# Patient Record
Sex: Male | Born: 1950 | Race: Black or African American | Hispanic: No | Marital: Married | Smoking: Never smoker
Health system: Southern US, Community
[De-identification: ages and names within clinical notes are randomized; demographics above are authoritative.]

## PROBLEM LIST (undated history)

## (undated) DIAGNOSIS — C801 Malignant (primary) neoplasm, unspecified: Secondary | ICD-10-CM

## (undated) DIAGNOSIS — C61 Malignant neoplasm of prostate: Secondary | ICD-10-CM

## (undated) DIAGNOSIS — K4090 Unilateral inguinal hernia, without obstruction or gangrene, not specified as recurrent: Secondary | ICD-10-CM

## (undated) DIAGNOSIS — K635 Polyp of colon: Secondary | ICD-10-CM

## (undated) DIAGNOSIS — E785 Hyperlipidemia, unspecified: Secondary | ICD-10-CM

## (undated) DIAGNOSIS — K649 Unspecified hemorrhoids: Secondary | ICD-10-CM

## (undated) DIAGNOSIS — I10 Essential (primary) hypertension: Secondary | ICD-10-CM

## (undated) DIAGNOSIS — K589 Irritable bowel syndrome without diarrhea: Secondary | ICD-10-CM

## (undated) DIAGNOSIS — R143 Flatulence: Secondary | ICD-10-CM

## (undated) DIAGNOSIS — K219 Gastro-esophageal reflux disease without esophagitis: Secondary | ICD-10-CM

## (undated) HISTORY — DX: Polyp of colon: K63.5

## (undated) HISTORY — DX: Gastro-esophageal reflux disease without esophagitis: K21.9

## (undated) HISTORY — DX: Unilateral inguinal hernia, without obstruction or gangrene, not specified as recurrent: K40.90

## (undated) HISTORY — DX: Hyperlipidemia, unspecified: E78.5

## (undated) HISTORY — DX: Irritable bowel syndrome without diarrhea: K58.9

## (undated) HISTORY — DX: Malignant (primary) neoplasm, unspecified: C80.1

## (undated) HISTORY — PX: ESOPHAGOGASTRODUODENOSCOPY: SHX1529

## (undated) HISTORY — DX: Unspecified hemorrhoids: K64.9

## (undated) HISTORY — PX: COLONOSCOPY: SHX174

---

## 2007-07-11 DIAGNOSIS — C801 Malignant (primary) neoplasm, unspecified: Secondary | ICD-10-CM

## 2007-07-11 HISTORY — PX: PROSTATE SURGERY: SHX751

## 2007-07-11 HISTORY — DX: Malignant (primary) neoplasm, unspecified: C80.1

## 2012-07-10 DIAGNOSIS — K4090 Unilateral inguinal hernia, without obstruction or gangrene, not specified as recurrent: Secondary | ICD-10-CM

## 2012-07-10 HISTORY — DX: Unilateral inguinal hernia, without obstruction or gangrene, not specified as recurrent: K40.90

## 2012-11-20 ENCOUNTER — Ambulatory Visit: Payer: Self-pay | Admitting: General Surgery

## 2012-11-26 ENCOUNTER — Encounter: Payer: Self-pay | Admitting: General Surgery

## 2012-11-26 ENCOUNTER — Ambulatory Visit (INDEPENDENT_AMBULATORY_CARE_PROVIDER_SITE_OTHER): Payer: BC Managed Care – PPO | Admitting: General Surgery

## 2012-11-26 VITALS — BP 118/74 | HR 74 | Resp 14 | Ht 70.0 in | Wt 181.0 lb

## 2012-11-26 DIAGNOSIS — K4090 Unilateral inguinal hernia, without obstruction or gangrene, not specified as recurrent: Secondary | ICD-10-CM

## 2012-11-26 NOTE — Progress Notes (Signed)
Patient ID: Gavin Hansen, male   DOB: 01-18-1951, 62 y.o.   MRN: [id number]  Chief Complaint  Patient presents with  . Abdominal Pain    hernia evaluation    HPI Gavin Hansen is a 62 y.o. male who presents for an evaluation of a possible hernia. The hernia is located in the left side of groin. The patient states he noticed the hernia approximately 2-3 months ago. He states he has occasional pain in this area but not often.  HPI  Past Medical History  Diagnosis Date  . Cancer 2009    prostate  . Colon polyps   . Hemorrhoid   . IBS (irritable bowel syndrome)   . Hyperlipidemia     Past Surgical History  Procedure Laterality Date  . Prostate surgery  2009    Family History  Problem Relation Age of Onset  . Cancer Father     prostate    Social History History  Substance Use Topics  . Smoking status: Never Smoker   . Smokeless tobacco: Never Used  . Alcohol Use: Yes     Comment: occasionally    No Known Allergies  Current Outpatient Prescriptions  Medication Sig Dispense Refill  . losartan (COZAAR) 50 MG tablet        No current facility-administered medications for this visit.    Review of Systems Review of Systems  Constitutional: Negative.   Respiratory: Negative.   Cardiovascular: Negative.   Gastrointestinal: Negative.     Blood pressure 118/74, pulse 74, resp. rate 14, height 5\' 10"  (1.778 m), weight 181 lb (82.101 kg).  Physical Exam Physical Exam  Constitutional: He appears well-developed and well-nourished.  Eyes: Conjunctivae are normal. No scleral icterus.  Neck: Trachea normal. No mass and no thyromegaly present.  Cardiovascular: Normal rate, regular rhythm, normal heart sounds and normal pulses.   No murmur heard. Pulmonary/Chest: Effort normal and breath sounds normal.  Abdominal: Soft. Normal appearance and bowel sounds are normal. There is no hepatosplenomegaly. There is no tenderness. A hernia is present.  Small reducible left inguinal  hernia.     Data Reviewed none  Assessment    Small left inguinal hernia     Plan    Recommended sutgical repair on elective basis. Laparoscopic and open techniques discudded and pros and cons explained.     Patient wishes to wait and have surgery around November 2014. He will be placed in recalls to come back to the office for a pre-op visit and at that time a surgery date will be arranged.   Vallarie Fei G 11/27/2012, 12:57 PM

## 2012-11-26 NOTE — Patient Instructions (Addendum)
Patient is advised to have hernia repaired at his convenience.  If symptoms become worse patient is advised to contact our office immediately.  Hernia Repair with Laparoscope A hernia occurs when an internal organ pushes out through a weak spot in the belly (abdominal) wall muscles. Hernias most commonly occur in the groin and around the navel. Hernias can also occur through a cut by the surgeon (incision) after an abdominal operation. A hernia may be caused by:  Lifting heavy objects.  Prolonged coughing.  Straining to move your bowels. Hernias can often be pushed back into place (reduced). Most hernias tend to get worse over time. Problems occur when abdominal contents get stuck in the opening and the blood supply is blocked or impaired (incarcerated hernia). Because of these risks, you require surgery to repair the hernia. Your hernia will be repaired using a laparoscope. Laparoscopic surgery is a type of minimally invasive surgery. It does not involve making a typical surgical cut (incision) in the skin. A laparoscope is a telescope-like rod and lens system. It is usually connected to a video camera and a light source so your caregiver can clearly see the operative area. The instruments are inserted through  to  inch (5 mm or 10 mm) openings in the skin at specific locations. A working and viewing space is created by blowing a small amount of carbon dioxide gas into the abdominal cavity. The abdomen is essentially blown up like a balloon (insufflated). This elevates the abdominal wall above the internal organs like a dome. The carbon dioxide gas is common to the human body and can be absorbed by tissue and removed by the respiratory system. Once the repair is completed, the small incisions will be closed with either stitches (sutures) or staples (just like a paper stapler only this staple holds the skin together). LET YOUR CAREGIVERS KNOW ABOUT:  Allergies.  Medications taken including herbs,  eye drops, over the counter medications, and creams.  Use of steroids (by mouth or creams).  Previous problems with anesthetics or Novocaine.  Possibility of pregnancy, if this applies.  History of blood clots (thrombophlebitis).  History of bleeding or blood problems.  Previous surgery.  Other health problems. BEFORE THE PROCEDURE  Laparoscopy can be done either in a hospital or out-patient clinic. You may be given a mild sedative to help you relax before the procedure. Once in the operating room, you will be given a general anesthesia to make you sleep (unless you and your caregiver choose a different anesthetic).  AFTER THE PROCEDURE  After the procedure you will be watched in a recovery area. Depending on what type of hernia was repaired, you might be admitted to the hospital or you might go home the same day. With this procedure you may have less pain and scarring. This usually results in a quicker recovery and less risk of infection. HOME CARE INSTRUCTIONS   Bed rest is not required. You may continue your normal activities but avoid heavy lifting (more than 10 pounds) or straining.  Cough gently. If you are a smoker it is best to stop, as even the best hernia repair can break down with the continual strain of coughing.  Avoid driving until given the OK by your surgeon.  There are no dietary restrictions unless given otherwise.  TAKE ALL MEDICATIONS AS DIRECTED.  Only take over-the-counter or prescription medicines for pain, discomfort, or fever as directed by your caregiver. SEEK MEDICAL CARE IF:   There is increasing abdominal pain or  pain in your incisions.  There is more bleeding from incisions, other than minimal spotting.  You feel light headed or faint.  You develop an unexplained fever, chills, and/or an oral temperature above 102 F (38.9 C).  You have redness, swelling, or increasing pain in the wound.  Pus coming from wound.  A foul smell coming from the  wound or dressings. SEEK IMMEDIATE MEDICAL CARE IF:   You develop a rash.  You have difficulty breathing.  You have any allergic problems. MAKE SURE YOU:   Understand these instructions.  Will watch your condition.  Will get help right away if you are not doing well or get worse. Document Released: 06/26/2005 Document Revised: 09/18/2011 Document Reviewed: 05/26/2009 [facility] Patient Information 2013 [place], [place].  Patient wishes to wait and have surgery around November 2014. He will be placed in recalls to come back to the office for a pre-op visit and at that time a surgery date will be arranged.

## 2012-11-27 ENCOUNTER — Encounter: Payer: Self-pay | Admitting: General Surgery

## 2013-06-04 ENCOUNTER — Ambulatory Visit: Payer: BC Managed Care – PPO | Admitting: General Surgery

## 2013-06-16 ENCOUNTER — Encounter: Payer: Self-pay | Admitting: General Surgery

## 2013-06-16 ENCOUNTER — Ambulatory Visit (INDEPENDENT_AMBULATORY_CARE_PROVIDER_SITE_OTHER): Payer: BC Managed Care – PPO | Admitting: General Surgery

## 2013-06-16 VITALS — Ht 70.0 in | Wt 191.0 lb

## 2013-06-16 DIAGNOSIS — K4090 Unilateral inguinal hernia, without obstruction or gangrene, not specified as recurrent: Secondary | ICD-10-CM | POA: Insufficient documentation

## 2013-06-16 NOTE — Progress Notes (Signed)
The patient presents for a 6 month follow up as well as a pre-op evaluation of a left inguinal hernia. The patient denies any new symptoms at this time.    Discussion with patient in regards to what type of hernia repair he would prefer. Patient had additional questions. He is unsure of time with his work schedule. He will let us know what he plans to do.  He had questions regarding open vs laparoscopic techniques, associated risks, benefits and activity restrictions. These were all explained to him.  Pt is asked to decide on a suitable time and will see him 1-2 weeks before.

## 2013-06-16 NOTE — Patient Instructions (Signed)
Patient to contact our office when he decides to have surgery.

## 2013-06-17 ENCOUNTER — Encounter: Payer: Self-pay | Admitting: General Surgery

## 2014-05-18 ENCOUNTER — Ambulatory Visit (INDEPENDENT_AMBULATORY_CARE_PROVIDER_SITE_OTHER): Payer: Medicare Other | Admitting: General Surgery

## 2014-05-18 ENCOUNTER — Encounter: Payer: Self-pay | Admitting: General Surgery

## 2014-05-18 VITALS — BP 134/78 | HR 72 | Resp 12 | Ht 70.0 in | Wt 191.0 lb

## 2014-05-18 DIAGNOSIS — K4090 Unilateral inguinal hernia, without obstruction or gangrene, not specified as recurrent: Secondary | ICD-10-CM

## 2014-05-18 NOTE — Patient Instructions (Addendum)

## 2014-05-18 NOTE — Progress Notes (Signed)
Patient ID: Gavin Hansen, male   DOB: 1950-12-17, 63 y.o.   MRN: [id number]  Chief Complaint  Patient presents with  . Other    left inguinal hernia    HPI Gavin Hansen is a 63 y.o. male here today to discuss left inguinal hernia surgery. He has a medium sized left ing hernia.  HPI  Past Medical History  Diagnosis Date  . Cancer 2009    prostate  . Colon polyps   . Hemorrhoid   . IBS (irritable bowel syndrome)   . Hyperlipidemia   . Left inguinal hernia 2014  . GERD (gastroesophageal reflux disease)     Past Surgical History  Procedure Laterality Date  . Prostate surgery  2009    Family History  Problem Relation Age of Onset  . Cancer Father     prostate    Social History History  Substance Use Topics  . Smoking status: Never Smoker   . Smokeless tobacco: Never Used  . Alcohol Use: Yes     Comment: occasionally    No Known Allergies  Current Outpatient Prescriptions  Medication Sig Dispense Refill  . losartan (COZAAR) 50 MG tablet Take 50 mg by mouth daily.      No current facility-administered medications for this visit.    Review of Systems Review of Systems  Constitutional: Negative.   Respiratory: Negative.   Cardiovascular: Negative.     Blood pressure 134/78, pulse 72, resp. rate 12, height 5\' 10"  (1.778 m), weight 191 lb (86.637 kg).  Physical Exam Physical Exam  Constitutional: He is oriented to person, place, and time. He appears well-developed and well-nourished.  Eyes: Conjunctivae are normal. No scleral icterus.  Neck: Neck supple.  Cardiovascular: Normal rate, regular rhythm and normal heart sounds.   Pulmonary/Chest: Effort normal and breath sounds normal.  Abdominal: Soft. Normal appearance and bowel sounds are normal. There is no hepatomegaly. There is no tenderness. A hernia is present. Hernia confirmed positive in the left inguinal area.  Neurological: He is alert and oriented to person, place, and time.  Skin: Skin is warm and dry.     Data Reviewed None  Assessment    Left inguinal hernia present- no change since last visit.     Plan  Discussed inguinal hernia surgery. Pt elected to have open repair under MAC     Patient's surgery has been scheduled for 07-01-14 at [facility].   SANKAR,SEEPLAPUTHUR G 05/18/2014, 8:24 PM

## 2014-06-01 ENCOUNTER — Ambulatory Visit: Payer: Self-pay | Admitting: Unknown Physician Specialty

## 2014-06-10 ENCOUNTER — Telehealth: Payer: Self-pay | Admitting: *Deleted

## 2014-06-10 NOTE — Telephone Encounter (Signed)
Please let pt know the treatment for H pylori should not interfere with the surgery

## 2014-06-10 NOTE — Telephone Encounter (Signed)
Message left on patient's cell phone stating that it is okay to proceed upcoming surgery. No need to return the call unless he has further questions.

## 2014-06-10 NOTE — Telephone Encounter (Signed)
Patient called the office to report he has bacteria in his stomach. This patient was told that it was H pylori and that it could lead to gastritis. The patient states that he has been placed on 2 antibiotics that he will start today and be on for 2 weeks. Patient is unsure of name of medications.   Patient is currently scheduled for left inguinal hernia repair on 07-01-14 at [facility].   He would like to know if this will interfere with scheduled surgery in three weeks.   Patient was told that this should be okay but that we would talk to Dr. Jamal Collin and get back with him if there is any indication to reschedule.

## 2014-06-19 ENCOUNTER — Other Ambulatory Visit: Payer: Self-pay | Admitting: General Surgery

## 2014-06-19 DIAGNOSIS — K4090 Unilateral inguinal hernia, without obstruction or gangrene, not specified as recurrent: Secondary | ICD-10-CM

## 2014-06-22 ENCOUNTER — Ambulatory Visit: Payer: Self-pay | Admitting: General Surgery

## 2014-07-01 ENCOUNTER — Ambulatory Visit: Payer: Self-pay | Admitting: General Surgery

## 2014-07-01 DIAGNOSIS — K4090 Unilateral inguinal hernia, without obstruction or gangrene, not specified as recurrent: Secondary | ICD-10-CM

## 2014-07-01 HISTORY — PX: HERNIA REPAIR: SHX51

## 2014-07-06 ENCOUNTER — Encounter: Payer: Self-pay | Admitting: General Surgery

## 2014-07-16 ENCOUNTER — Ambulatory Visit (INDEPENDENT_AMBULATORY_CARE_PROVIDER_SITE_OTHER): Payer: Self-pay | Admitting: General Surgery

## 2014-07-16 ENCOUNTER — Encounter: Payer: Self-pay | Admitting: General Surgery

## 2014-07-16 VITALS — BP 122/78 | HR 82 | Resp 12 | Ht 70.0 in | Wt 196.0 lb

## 2014-07-16 DIAGNOSIS — K4090 Unilateral inguinal hernia, without obstruction or gangrene, not specified as recurrent: Secondary | ICD-10-CM

## 2014-07-16 NOTE — Patient Instructions (Addendum)
The patient is aware to call back for any questions or concerns. Gradually increase activity.

## 2014-07-16 NOTE — Progress Notes (Signed)
He is here today for his postoperative visit, left inguinal hernia repair on 07-01-14. He states he is doing well. Minimal tenderness. Bowels still a little constipated but he is taking stool softeners.  Incision healing well, repair is intact.  Gradually increase activity.  Follow up in one month.

## 2014-07-17 ENCOUNTER — Encounter: Payer: Self-pay | Admitting: General Surgery

## 2014-08-17 ENCOUNTER — Ambulatory Visit: Payer: Medicare Other | Admitting: General Surgery

## 2014-09-10 ENCOUNTER — Encounter: Payer: Self-pay | Admitting: *Deleted

## 2014-09-28 ENCOUNTER — Encounter: Payer: Self-pay | Admitting: General Surgery

## 2014-09-28 ENCOUNTER — Ambulatory Visit (INDEPENDENT_AMBULATORY_CARE_PROVIDER_SITE_OTHER): Payer: Medicare Other | Admitting: General Surgery

## 2014-09-28 VITALS — Ht 70.0 in

## 2014-09-28 DIAGNOSIS — K4090 Unilateral inguinal hernia, without obstruction or gangrene, not specified as recurrent: Secondary | ICD-10-CM

## 2014-09-28 NOTE — Progress Notes (Signed)
Patient ID: Gavin Hansen, male   DOB: [birth year], 64 y.o.   MRN: [id number] He is here today for his postoperative visit, left inguinal hernia repair on 07-01-14. He states he is doing well. He denies any tenderness. He reports that the previous constipation has resolved itself.  Incision has healed well and is intact. No hernia noted Patient will return as needed. No activity restrictions.

## 2014-09-28 NOTE — Patient Instructions (Signed)
Call with any problems

## 2014-09-30 ENCOUNTER — Encounter: Payer: Self-pay | Admitting: General Surgery

## 2014-11-02 LAB — SURGICAL PATHOLOGY

## 2014-11-04 NOTE — Op Note (Signed)
PATIENT NAME:  Gavin Hansen, Gavin Hansen MR#:  [id number] DATE OF BIRTH:  08-11-1950  DATE OF PROCEDURE:  07/01/2014  PREOPERATIVE DIAGNOSIS: Left inguinal hernia.   POSTOPERATIVE DIAGNOSIS: Left inguinal  hernia.   OPERATION: Repair of left inguinal hernia.   SURGEON: Mckinley Jewel, M.D.   ANESTHESIA: Monitored care with local anesthetic containing 0.5% Marcaine with 1% Xylocaine.   COMPLICATIONS: None.   ESTIMATED BLOOD LOSS: Minimal.   DRAINS: None.   DESCRIPTION OF PROCEDURE: The patient was placed in the supine position on the operating table. With adequate sedation and monitoring, the left groin was prepped and draped out as a sterile field. Timeout was performed. Anterior/superior iliac spine and the pubic tubercle were marked with incision mapped out along the medial two-thirds of the inguinal canal. This area was infiltrated with local anesthetic, and then a skin incision was made and deepened through to the external oblique and bleeding controlled with cautery and ligatures of 3-0 Vicryl. The deeper tissue was instilled with local anesthetic, as also the inguinal canal. The external oblique was opened along the line of its fibers. Exploration revealed the patient, in fact, had a very long and thick hernial protrusion containing actually a sac and a portion of preperitoneal tissue. This was located just medial to the internal ring and was therefore essentially a direct hernia, although had an indirect type sac. After ensuring there were no other hernias within the sac, the fascia overlying the cord was closed with 2-0 Vicryl. The hernia was satisfactorily exposed along the posterior wall of the inguinal canal just medial to the internal ring. The cord was satisfactorily freed from the posterior wall to allow for this. The sac was excised out completely and then suture ligated with 2-0 Vicryl, and this was transfixed to the internal oblique muscle and the superior edge. The opening within the  transversalis fascia was then closed with 2 figure-of-eight stitches of 2-0 Vicryl. The ProGrip mesh was brought up to the field and then placed around the internal ring, and was snugged down to the posterior wall with the lateral ends tucked underneath the external oblique. The medial end was fixed to the pubic tubercle with a single 2-0 PDS stitch.   After the area was inspected once again, the repair was noted to be adequate, and hemostasis was intact. The wound was irrigated and closed. The external oblique closed with a running 2-0 PDS. The subcutaneous tissue was closed with 3-0 Vicryl and the skin with subcuticular 4-0 Vicryl, reinforced with LiquiBand. The procedure was well tolerated. He was subsequently returned to the recovery room in stable condition.    ____________________________ S.Robinette Haines, MD sgs:JT D: 07/01/2014 14:14:08 ET T: 07/01/2014 14:20:54 ET JOB#: [id number]  cc: S.G. Jamal Collin, MD, <Dictator> [facility] Robinette Haines MD ELECTRONICALLY SIGNED 07/16/2014 6:43

## 2016-06-04 IMAGING — US ABDOMEN ULTRASOUND
1 series · 14 of 25 positions shown · non-contrast
Comparison: None.

CLINICAL DATA: Epigastric pain

EXAM:
ULTRASOUND ABDOMEN COMPLETE

[Series 1: abdomen ultrasound · 0.30mm/px · 14 of 77 slices shown]
[im 1/77]
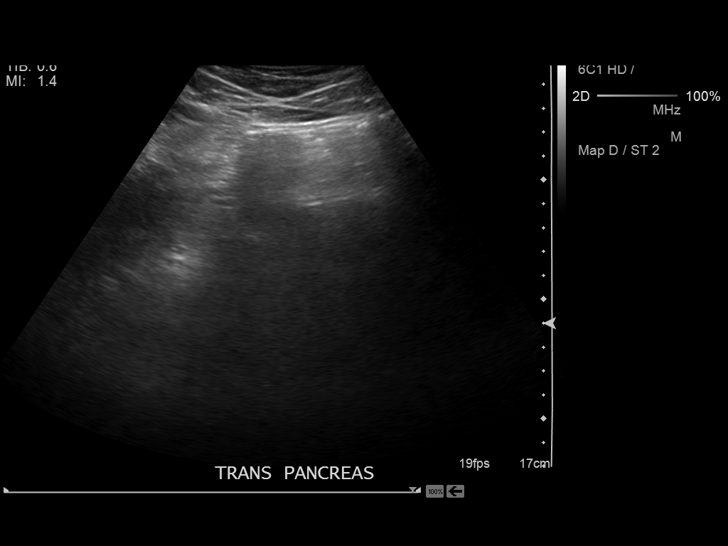
[im 7/77]
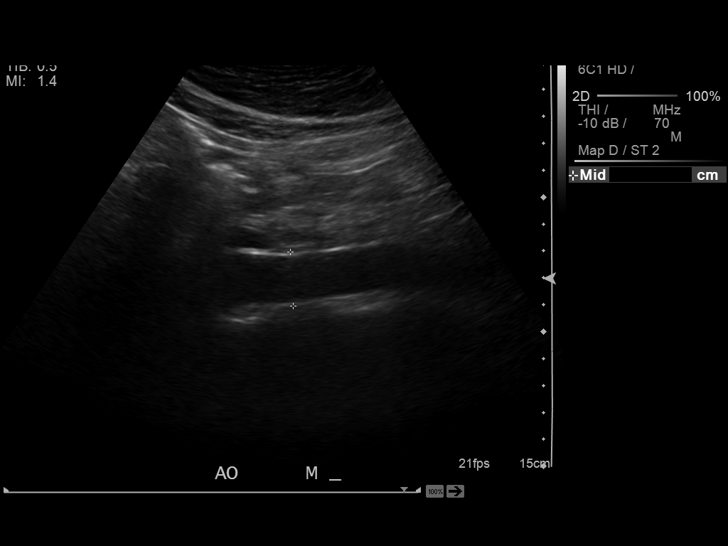
[im 13/77]
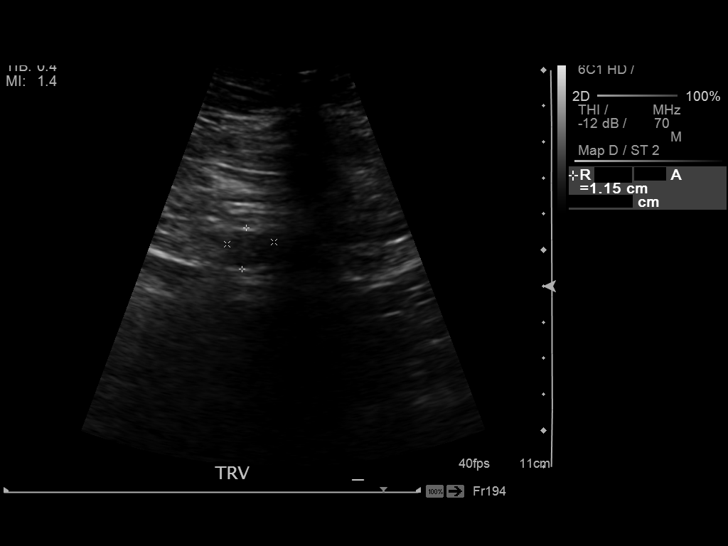
[im 20/77]
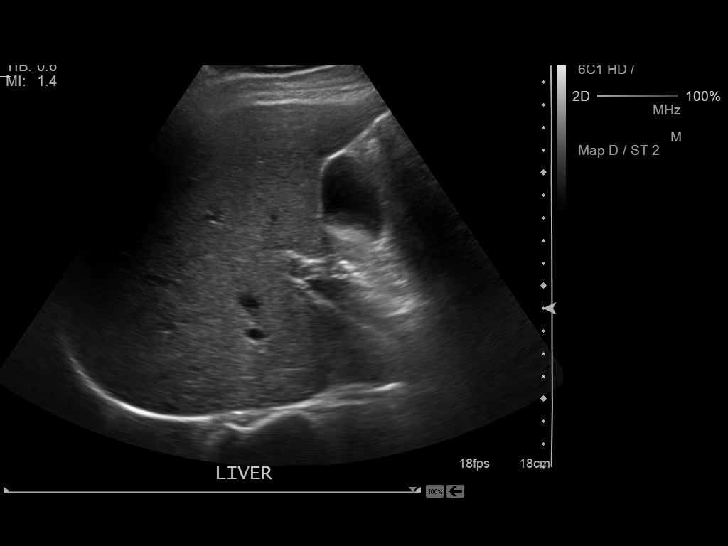
[im 26/77]
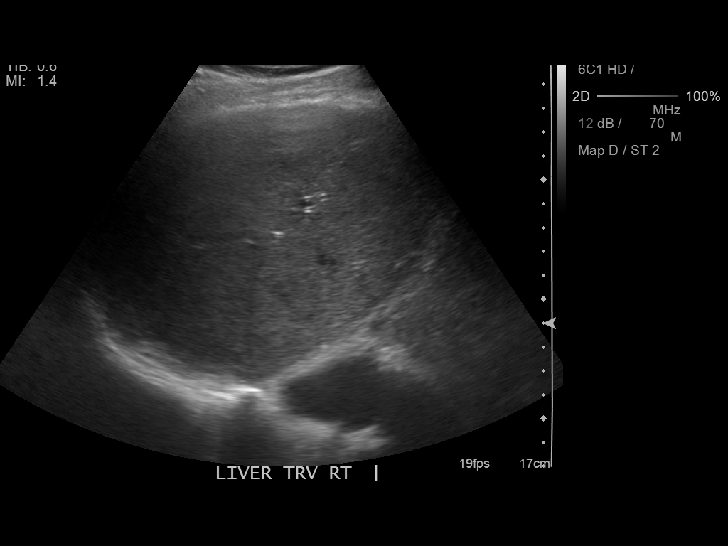
[im 29/77]
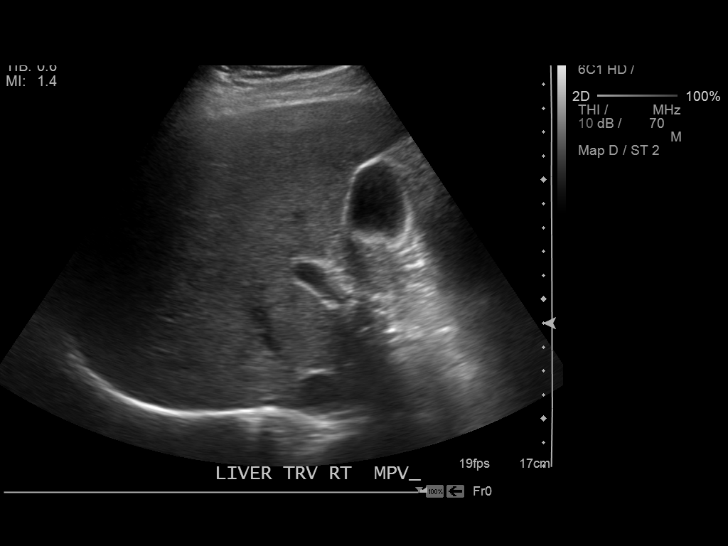
[im 35/77]
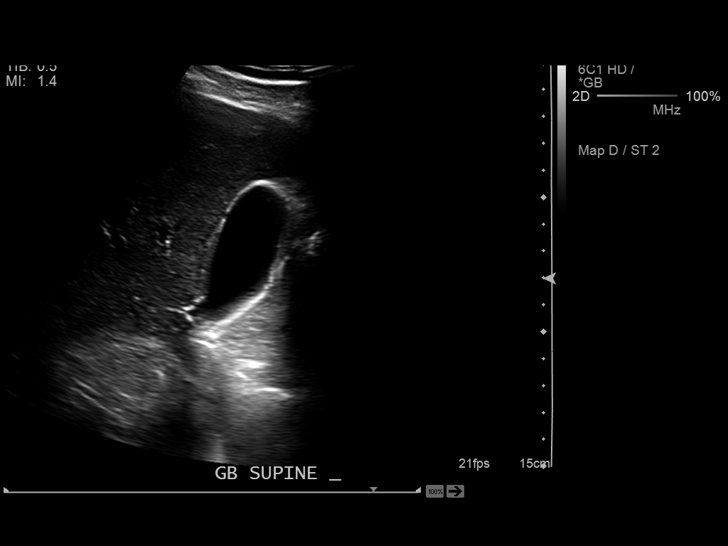
[im 42/77]
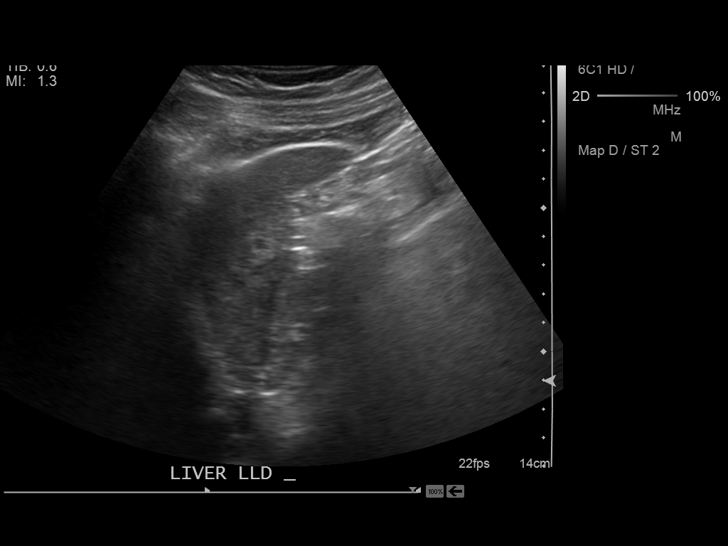
[im 48/77]
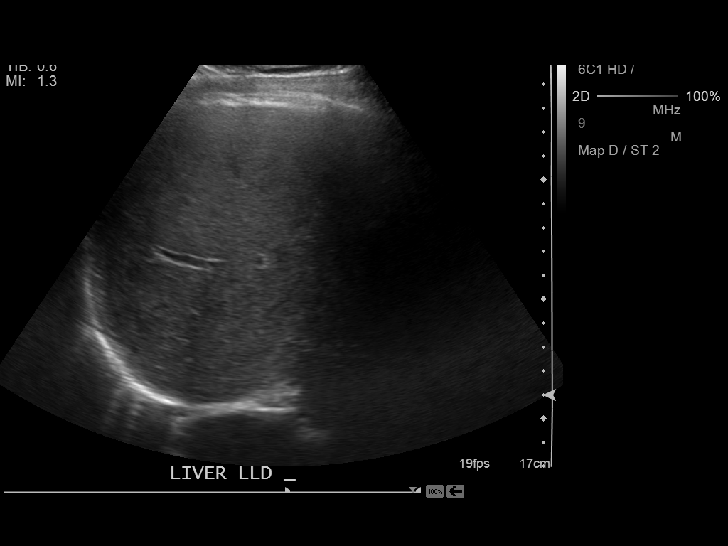
[im 51/77]
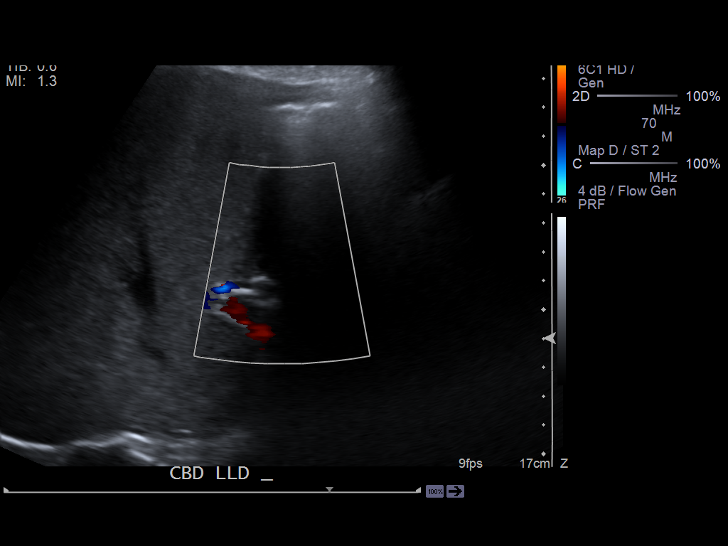
[im 58/77]
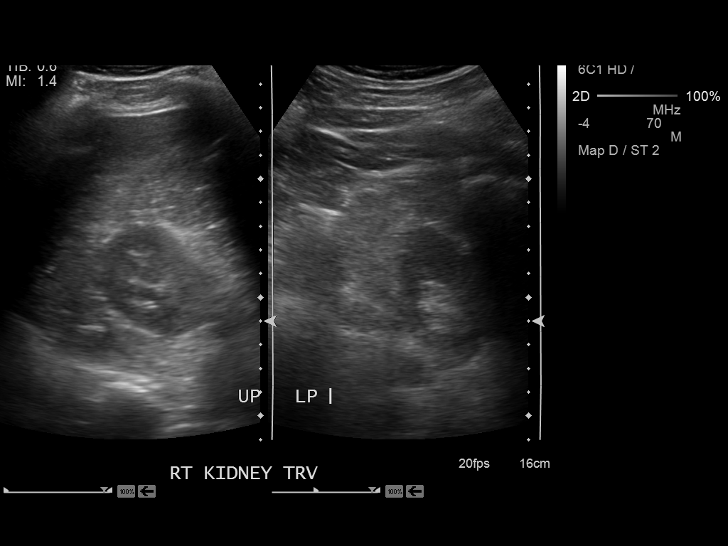
[im 64/77]
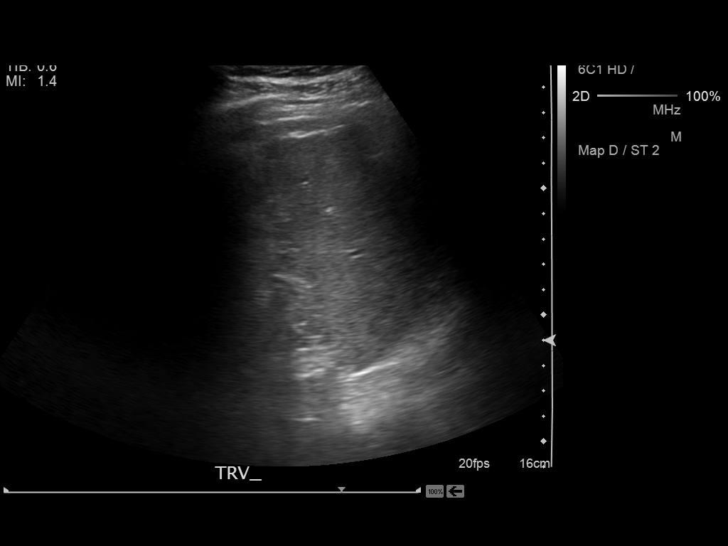
[im 70/77]
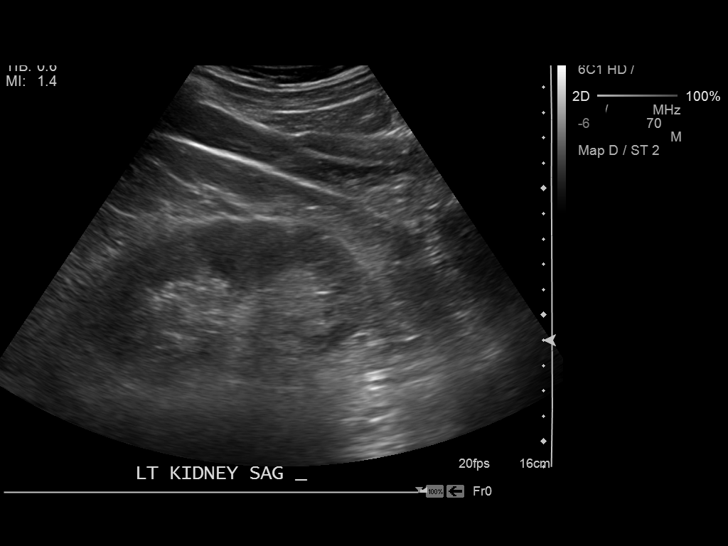
[im 77/77]
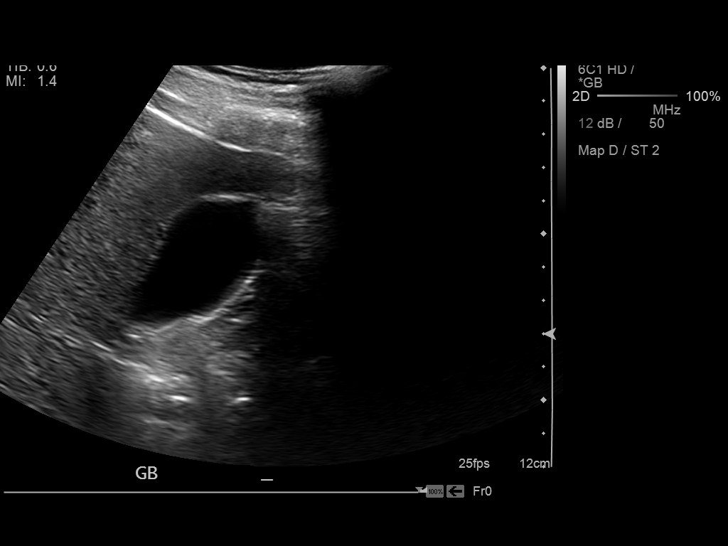

[14 of 25 positions shown; findings below may reference images not displayed]

FINDINGS: Gallbladder: No gallstones or wall thickening visualized. No
sonographic Murphy sign noted.

Common bile duct: Diameter: 4.4 mm in diameter within normal limits.

Liver: No focal lesion identified. Within normal limits in
parenchymal echogenicity.

IVC: No abnormality visualized.

Pancreas: Not visualized due to abundant bowel gas

Spleen: Size and appearance within normal limits.

Right Kidney: Length: 12.5 cm. Echogenicity within normal limits. No
mass or hydronephrosis visualized.

Left Kidney: Length: 11.7 cm. Echogenicity within normal limits. No
mass or hydronephrosis visualized.

Abdominal aorta: No aneurysm visualized. Measures up to 2.7 cm in
diameter.

Other findings: None.
IMPRESSION: Normal abdominal ultrasound.

## 2019-04-18 ENCOUNTER — Other Ambulatory Visit
Admission: RE | Admit: 2019-04-18 | Discharge: 2019-04-18 | Disposition: A | Discharge status: Home / Self Care | Payer: Medicare Other | Source: Ambulatory Visit | Attending: Internal Medicine | Admitting: Internal Medicine

## 2019-04-18 ENCOUNTER — Other Ambulatory Visit: Payer: Self-pay

## 2019-04-18 DIAGNOSIS — Z20828 Contact with and (suspected) exposure to other viral communicable diseases: Secondary | ICD-10-CM | POA: Insufficient documentation

## 2019-04-18 DIAGNOSIS — Z01812 Encounter for preprocedural laboratory examination: Secondary | ICD-10-CM | POA: Insufficient documentation

## 2019-04-18 LAB — SARS CORONAVIRUS 2 (TAT 6-24 HRS): SARS Coronavirus 2: NEGATIVE

## 2019-04-22 ENCOUNTER — Encounter: Payer: Self-pay | Admitting: *Deleted

## 2019-04-23 ENCOUNTER — Encounter
Admission: RE | Disposition: A | Discharge status: Home / Self Care | Payer: Self-pay | Source: Home / Self Care | Attending: Internal Medicine

## 2019-04-23 ENCOUNTER — Ambulatory Visit: Payer: Medicare Other | Admitting: Anesthesiology

## 2019-04-23 ENCOUNTER — Encounter: Payer: Self-pay | Admitting: *Deleted

## 2019-04-23 ENCOUNTER — Other Ambulatory Visit: Payer: Self-pay

## 2019-04-23 ENCOUNTER — Ambulatory Visit
Admission: RE | Admit: 2019-04-23 | Discharge: 2019-04-23 | Disposition: A | Discharge status: Home / Self Care | Payer: Medicare Other | Attending: Internal Medicine | Admitting: Internal Medicine

## 2019-04-23 DIAGNOSIS — I10 Essential (primary) hypertension: Secondary | ICD-10-CM | POA: Insufficient documentation

## 2019-04-23 DIAGNOSIS — K219 Gastro-esophageal reflux disease without esophagitis: Secondary | ICD-10-CM | POA: Insufficient documentation

## 2019-04-23 DIAGNOSIS — Z8546 Personal history of malignant neoplasm of prostate: Secondary | ICD-10-CM | POA: Insufficient documentation

## 2019-04-23 DIAGNOSIS — R194 Change in bowel habit: Secondary | ICD-10-CM | POA: Insufficient documentation

## 2019-04-23 DIAGNOSIS — K580 Irritable bowel syndrome with diarrhea: Secondary | ICD-10-CM | POA: Insufficient documentation

## 2019-04-23 DIAGNOSIS — Z79899 Other long term (current) drug therapy: Secondary | ICD-10-CM | POA: Diagnosis not present

## 2019-04-23 DIAGNOSIS — Z86010 Personal history of colonic polyps: Secondary | ICD-10-CM | POA: Diagnosis not present

## 2019-04-23 DIAGNOSIS — E785 Hyperlipidemia, unspecified: Secondary | ICD-10-CM | POA: Diagnosis not present

## 2019-04-23 HISTORY — DX: Flatulence: R14.3

## 2019-04-23 HISTORY — DX: Essential (primary) hypertension: I10

## 2019-04-23 HISTORY — DX: Malignant neoplasm of prostate: C61

## 2019-04-23 HISTORY — PX: COLONOSCOPY WITH PROPOFOL: SHX5780

## 2019-04-23 SURGERY — COLONOSCOPY WITH PROPOFOL
Anesthesia: General

## 2019-04-23 MED ORDER — PROPOFOL 10 MG/ML IV BOLUS
INTRAVENOUS | Status: DC | PRN
  Administered 2019-04-23: 20 mg via INTRAVENOUS
  Administered 2019-04-23: 10 mg via INTRAVENOUS
  Administered 2019-04-23: 50 mg via INTRAVENOUS

## 2019-04-23 MED ORDER — EPHEDRINE SULFATE 50 MG/ML IJ SOLN
INTRAMUSCULAR | Status: DC | PRN
  Administered 2019-04-23 (×2): 5 mg via INTRAVENOUS

## 2019-04-23 MED ORDER — SODIUM CHLORIDE 0.9 % IV SOLN
INTRAVENOUS | Status: DC
  Administered 2019-04-23: 1000 mL/kg/h via INTRAVENOUS
  Administered 2019-04-23: 13:00:00 via INTRAVENOUS

## 2019-04-23 MED ORDER — LIDOCAINE HCL (CARDIAC) PF 100 MG/5ML IV SOSY
PREFILLED_SYRINGE | INTRAVENOUS | Status: DC | PRN
  Administered 2019-04-23: 100 mg via INTRATRACHEAL

## 2019-04-23 MED ORDER — PROPOFOL 500 MG/50ML IV EMUL
INTRAVENOUS | Status: DC | PRN
  Administered 2019-04-23: 150 ug/kg/min via INTRAVENOUS

## 2019-04-23 MED ORDER — GLYCOPYRROLATE 0.2 MG/ML IJ SOLN
INTRAMUSCULAR | Status: DC | PRN
  Administered 2019-04-23: 0.2 mg via INTRAVENOUS

## 2019-04-23 NOTE — Anesthesia Postprocedure Evaluation (Signed)
Anesthesia Post Note  Patient: Gavin Hansen  Procedure(s) Performed: COLONOSCOPY WITH PROPOFOL (N/A )  Patient location during evaluation: Endoscopy Anesthesia Type: General Level of consciousness: awake and alert Pain management: pain level controlled Vital Signs Assessment: post-procedure vital signs reviewed and stable Respiratory status: spontaneous breathing and respiratory function stable Cardiovascular status: stable Anesthetic complications: no     Last Vitals:  Vitals:   04/23/19 1146 04/23/19 1319  BP: 114/75 (!) 88/54  Pulse: 68 97  Resp: 18 17  Temp: (!) 35.8 C (!) 36.2 C  SpO2: 100% 100%    Last Pain:  Vitals:   04/23/19 1319  TempSrc:   PainSc: Asleep                 KEPHART,WILLIAM K

## 2019-04-23 NOTE — Anesthesia Post-op Follow-up Note (Signed)
Anesthesia QCDR form completed.        

## 2019-04-23 NOTE — Transfer of Care (Signed)
Immediate Anesthesia Transfer of Care Note  Patient: ENES HUDZIK  Procedure(s) Performed: COLONOSCOPY WITH PROPOFOL (N/A )  Patient Location: Endoscopy Unit  Anesthesia Type:General  Level of Consciousness: awake, drowsy and patient cooperative  Airway & Oxygen Therapy: Patient Spontanous Breathing and Patient connected to face mask oxygen  Post-op Assessment: Report given to RN and Post -op Vital signs reviewed and stable  Post vital signs: Reviewed and stable  Last Vitals:  Vitals Value Taken Time  BP 88/54 04/23/19 1319  Temp 36.2 C 04/23/19 1319  Pulse 97 04/23/19 1320  Resp 17 04/23/19 1320  SpO2 99 % 04/23/19 1320  Vitals shown include unvalidated device data.  Last Pain:  Vitals:   04/23/19 1319  TempSrc:   PainSc: Asleep         Complications: No apparent anesthesia complications

## 2019-04-23 NOTE — Op Note (Signed)
[facility] Gastroenterology Patient Name: Gavin Hansen Procedure Date: 04/23/2019 12:18 PM MRN: [id number] Account #: [id number] Date of Birth: 23-Jun-1951 Admit Type: Outpatient Age: 68 Room: [facility] ENDO ROOM 3 Gender: Male Note Status: Finalized Procedure:            Colonoscopy Indications:          Functional diarrhea Providers:            Benay Pike. Alice Reichert MD, MD Referring MD:         Leona Carry. Hall Busing, MD (Referring MD) Medicines:            Propofol per Anesthesia Complications:        No immediate complications. Procedure:            Pre-Anesthesia Assessment:                       - The risks and benefits of the procedure and the                        sedation options and risks were discussed with the                        patient. All questions were answered and informed                        consent was obtained.                       - Patient identification and proposed procedure were                        verified prior to the procedure by the nurse. The                        procedure was verified in the procedure room.                       - After reviewing the risks and benefits, the patient                        was deemed in satisfactory condition to undergo the                        procedure.                       - ASA Grade Assessment: III - A patient with severe                        systemic disease.                       After obtaining informed consent, the colonoscope was                        passed under direct vision. Throughout the procedure,                        the patient's blood pressure, pulse, and oxygen  saturations were monitored continuously. The                        Colonoscope was introduced through the anus and                        advanced to the the terminal ileum, with identification                        of the appendiceal orifice and IC valve. The                        colonoscopy  was performed without difficulty. The                        patient tolerated the procedure well. The quality of                        the bowel preparation was excellent. The terminal                        ileum, ileocecal valve, appendiceal orifice, and rectum                        were photographed. Findings:      The perianal and digital rectal examinations were normal. Pertinent       negatives include normal sphincter tone and no palpable rectal lesions.      The terminal ileum appeared normal.      The colon (entire examined portion) appeared normal. Biopsies for       histology were taken with a cold forceps from the random colon for       evaluation of microscopic colitis.      The exam was otherwise without abnormality on direct and retroflexion       views. Impression:           - The examined portion of the ileum was normal.                       - The entire examined colon is normal. Biopsied.                       - The examination was otherwise normal on direct and                        retroflexion views. Recommendation:       - Patient has a contact number available for                        emergencies. The signs and symptoms of potential                        delayed complications were discussed with the patient.                        Return to normal activities tomorrow. Written discharge                        instructions were provided to the patient.                       -  Resume previous diet.                       - Continue present medications.                       - Await pathology results.                       - Repeat colonoscopy in 5 years for surveillance.                       - Return to nurse practitioner in 3 months.                       - The findings and recommendations were discussed with                        the patient. Procedure Code(s):    --- Professional ---                       [phone], Colonoscopy, flexible; with biopsy, single  or                        multiple Diagnosis Code(s):    --- Professional ---                       K59.1, Functional diarrhea CPT copyright 2019 [employer]. All rights reserved. The codes documented in this report are preliminary and upon coder review may  be revised to meet current compliance requirements. Efrain Sella MD, MD 04/23/2019 1:18:33 PM This report has been signed electronically. Number of Addenda: 0 Note Initiated On: 04/23/2019 12:18 PM Scope Withdrawal Time: 0 hours 6 minutes 50 seconds  Total Procedure Duration: 0 hours 15 minutes 41 seconds  Estimated Blood Loss: Estimated blood loss: none.      [facility]

## 2019-04-23 NOTE — H&P (Signed)
Outpatient short stay form Pre-procedure 04/23/2019 12:46 PM Teodoro K. Alice Reichert, M.D.  Primary Physician: Benita Stabile, M.D.  Reason for visit:  Change in bowel habits, hx of adenomatous colon polyps 04/27/2016 colonoscopy  History of present illness:                           Patient presents for colonoscopy for a personal hx of colon polyps. The patient denies abdominal pain, abnormal weight loss or rectal bleeding. Patient reports some change in bowel habits with nocturnal fecal urgency.     Current Facility-Administered Medications:  .  0.9 %  sodium chloride infusion, , Intravenous, Continuous, [place], Benay Pike, MD, 1,000 mL/kg/hr at 04/23/19 1210  Medications Prior to Admission  Medication Sig Dispense Refill Last Dose  . b complex vitamins tablet Take 1 tablet by mouth daily.   Past Week at Unknown time  . Cholecalciferol (VITAMIN D PO) Take 1 tablet by mouth daily.   Past Week at Unknown time  . EVENING PRIMROSE OIL PO Take by mouth daily.   Past Week at Unknown time  . losartan (COZAAR) 50 MG tablet Take 50 mg by mouth daily.    04/22/2019 at Unknown time  . Multiple Vitamin (MULTIVITAMIN) tablet Take 1 tablet by mouth daily.   Past Week at Unknown time  . Red Yeast Rice Extract (RED YEAST RICE PO) Take 2 capsules by mouth daily.   Past Week at Unknown time  . vitamin C (ASCORBIC ACID) 500 MG tablet Take 500 mg by mouth daily.   Past Week at Unknown time  . omeprazole (PRILOSEC) 40 MG capsule Take 40 mg by mouth daily.   Not Taking at Unknown time     No Known Allergies   Past Medical History:  Diagnosis Date  . Cancer [facility]) 2009   prostate  . Colon polyps   . Excessive flatus   . GERD (gastroesophageal reflux disease)   . Hemorrhoid   . Hyperlipidemia   . Hypertension   . IBS (irritable bowel syndrome)   . Left inguinal hernia 2014  . Prostate cancer [facility])     Review of systems:  Otherwise negative.    Physical Exam  Gen: Alert, oriented. Appears stated age.   HEENT: [place]/AT. PERRLA. Lungs: CTA, no wheezes. CV: RR nl S1, S2. Abd: soft, benign, no masses. BS+ Ext: No edema. Pulses 2+    Planned procedures: Proceed with colonoscopy with biopsy. The patient understands the nature of the planned procedure, indications, risks, alternatives and potential complications including but not limited to bleeding, infection, perforation, damage to internal organs and possible oversedation/side effects from anesthesia. The patient agrees and gives consent to proceed.  Please refer to procedure notes for findings, recommendations and patient disposition/instructions.     Teodoro K. Alice Reichert, M.D. Gastroenterology 04/23/2019  12:46 PM

## 2019-04-23 NOTE — Interval H&P Note (Signed)
History and Physical Interval Note:  04/23/2019 12:49 PM  Gavin Hansen  has presented today for surgery, with the diagnosis of ph. polyps.  The various methods of treatment have been discussed with the patient and family. After consideration of risks, benefits and other options for treatment, the patient has consented to  Procedure(s): COLONOSCOPY WITH PROPOFOL (N/A) as a surgical intervention.  The patient's history has been reviewed, patient examined, no change in status, stable for surgery.  I have reviewed the patient's chart and labs.  Questions were answered to the patient's satisfaction.     [place], [employer]

## 2019-04-23 NOTE — Anesthesia Preprocedure Evaluation (Signed)
Anesthesia Evaluation  Patient identified by MRN, date of birth, ID band Patient awake    Reviewed: Allergy & Precautions, NPO status , Patient's Chart, lab work & pertinent test results  History of Anesthesia Complications Negative for: history of anesthetic complications  Airway Mallampati: II       Dental   Pulmonary neg sleep apnea, neg COPD, Not current smoker,           Cardiovascular hypertension, Pt. on medications (-) Past MI and (-) CHF (-) dysrhythmias (-) Valvular Problems/Murmurs     Neuro/Psych neg Seizures    GI/Hepatic Neg liver ROS, GERD  Medicated and Controlled,  Endo/Other  neg diabetes  Renal/GU negative Renal ROS     Musculoskeletal   Abdominal   Peds  Hematology   Anesthesia Other Findings   Reproductive/Obstetrics                             Anesthesia Physical Anesthesia Plan  ASA: II  Anesthesia Plan: General   Post-op Pain Management:    Induction: Intravenous  PONV Risk Score and Plan: 2 and Propofol infusion and TIVA  Airway Management Planned: Nasal Cannula  Additional Equipment:   Intra-op Plan:   Post-operative Plan:   Informed Consent: I have reviewed the patients History and Physical, chart, labs and discussed the procedure including the risks, benefits and alternatives for the proposed anesthesia with the patient or authorized representative who has indicated his/her understanding and acceptance.       Plan Discussed with:   Anesthesia Plan Comments:         Anesthesia Quick Evaluation

## 2019-04-24 ENCOUNTER — Encounter: Payer: Self-pay | Admitting: Internal Medicine

## 2019-04-24 LAB — SURGICAL PATHOLOGY

## 2020-02-28 ENCOUNTER — Other Ambulatory Visit: Payer: Self-pay

## 2020-02-28 ENCOUNTER — Emergency Department (HOSPITAL_COMMUNITY)
Admission: EM | Admit: 2020-02-28 | Discharge: 2020-02-28 | Disposition: A | Discharge status: Home / Self Care | Payer: Medicare PPO | Attending: Emergency Medicine | Admitting: Emergency Medicine

## 2020-02-28 ENCOUNTER — Encounter (HOSPITAL_COMMUNITY): Payer: Self-pay

## 2020-02-28 ENCOUNTER — Emergency Department (HOSPITAL_COMMUNITY): Payer: Medicare PPO

## 2020-02-28 DIAGNOSIS — Y92512 Supermarket, store or market as the place of occurrence of the external cause: Secondary | ICD-10-CM | POA: Insufficient documentation

## 2020-02-28 DIAGNOSIS — Z8546 Personal history of malignant neoplasm of prostate: Secondary | ICD-10-CM | POA: Insufficient documentation

## 2020-02-28 DIAGNOSIS — Y999 Unspecified external cause status: Secondary | ICD-10-CM | POA: Insufficient documentation

## 2020-02-28 DIAGNOSIS — M25561 Pain in right knee: Secondary | ICD-10-CM | POA: Diagnosis present

## 2020-02-28 DIAGNOSIS — Y9301 Activity, walking, marching and hiking: Secondary | ICD-10-CM | POA: Insufficient documentation

## 2020-02-28 DIAGNOSIS — Z79899 Other long term (current) drug therapy: Secondary | ICD-10-CM | POA: Diagnosis not present

## 2020-02-28 DIAGNOSIS — I10 Essential (primary) hypertension: Secondary | ICD-10-CM | POA: Insufficient documentation

## 2020-02-28 DIAGNOSIS — W19XXXA Unspecified fall, initial encounter: Secondary | ICD-10-CM | POA: Insufficient documentation

## 2020-02-28 MED ORDER — HYDROCODONE-ACETAMINOPHEN 5-325 MG PO TABS: 1.0000 | ORAL_TABLET | Freq: Four times a day (QID) | ORAL | 0 refills | Status: AC | PRN

## 2020-02-28 MED ORDER — HYDROCODONE-ACETAMINOPHEN 5-325 MG PO TABS
1.0000 | ORAL_TABLET | Freq: Once | ORAL | Status: AC
  Administered 2020-02-28: 1 via ORAL
  Filled 2020-02-28: qty 1

## 2020-02-28 NOTE — ED Notes (Signed)
Patient transported to X-ray 

## 2020-02-28 NOTE — ED Triage Notes (Signed)
Pt states he was leaving [employer], he was adjusting his mask when he fell off the curb. He states he didn't notice it. Landed on his right knee. Having some trouble ambulating and straightening it out.  Alert and oriented.

## 2020-02-28 NOTE — ED Notes (Signed)
ED Provider at bedside. 

## 2020-02-28 NOTE — Discharge Instructions (Signed)
As we discussed, your x-ray here did not show any evidence of broken bone.  As we discussed, I am concerned that you may have injured your tendon.  This will need to follow-up with orthopedic.  I provided both orthopedic doctor in [place] in [place].  Call their offices and arrange for an appointment.  Wear knee immobilizer for support and stabilization.  Use crutches to prevent weightbearing on that leg.  I have also provided you a prescription for a walker that you can take to a medical device store.  You can take 1000 mg of Tylenol.  Do not exceed 4000 mg of Tylenol a day.  Take pain medications as directed for break through pain. Do not drive or operate machinery while taking this medication.   Return the emergency department for any worsening pain, numbness/weakness or any other worsening concerning symptoms.

## 2020-02-28 NOTE — ED Provider Notes (Signed)
[facility] EMERGENCY DEPARTMENT Provider Note   CSN: [id number] Arrival date & time: 02/28/20  2028     History Chief Complaint  Patient presents with  . Knee Pain    right    Gavin Hansen is a 69 y.o. male past medical history of hyperlipidemia, hypertension, IBS who presents for evaluation of right knee pain, swelling after mechanical fall that occurred about 7 PM this evening.  He reports that he was walking out of the [employer] store.  He states that he was putting his mask back on and states that he did not see the curb, causing him to fall.  He reports that he landed directly on his right knee.  He denies any head injury, LOC.  He states that since then, he has had pain, swelling to the knee and has difficulty lifting the leg up.  He states that he has barely been able to put any weight or ambulate on it.  He took a few steps and had to have assistance of a car.  He denies any numbness/weakness.  The history is provided by the patient.       Past Medical History:  Diagnosis Date  . Cancer [facility]) 2009   prostate  . Colon polyps   . Excessive flatus   . GERD (gastroesophageal reflux disease)   . Hemorrhoid   . Hyperlipidemia   . Hypertension   . IBS (irritable bowel syndrome)   . Left inguinal hernia 2014  . Prostate cancer [facility])     Patient Active Problem List   Diagnosis Date Noted  . Left inguinal hernia     Past Surgical History:  Procedure Laterality Date  . COLONOSCOPY    . COLONOSCOPY WITH PROPOFOL N/A 04/23/2019   Procedure: COLONOSCOPY WITH PROPOFOL;  Surgeon: Toledo, Benay Pike, MD;  Location: [facility] ENDOSCOPY;  Service: Gastroenterology;  Laterality: N/A;  . ESOPHAGOGASTRODUODENOSCOPY    . HERNIA REPAIR Left 07-01-14  . PROSTATE SURGERY  2009       Family History  Problem Relation Age of Onset  . Cancer Father        prostate  . Diabetes Brother     Social History   Tobacco Use  . Smoking status: Never Smoker  . Smokeless tobacco: Never Used    Vaping Use  . Vaping Use: Never used  Substance Use Topics  . Alcohol use: Yes    Comment: occasionally  . Drug use: Never    Home Medications Prior to Admission medications   Medication Sig Start Date End Date Taking? Authorizing Provider  b complex vitamins tablet Take 1 tablet by mouth daily.    [provider]  Cholecalciferol (VITAMIN D PO) Take 1 tablet by mouth daily.    [provider]  EVENING PRIMROSE OIL PO Take by mouth daily.    [provider]  HYDROcodone-acetaminophen (NORCO/VICODIN) 5-325 MG tablet Take 1-2 tablets by mouth Hansen 6 (six) hours as needed. 02/28/20   Volanda Napoleon, PA-C  losartan (COZAAR) 50 MG tablet Take 50 mg by mouth daily.  11/04/12   [provider]  Multiple Vitamin (MULTIVITAMIN) tablet Take 1 tablet by mouth daily.    [provider]  omeprazole (PRILOSEC) 40 MG capsule Take 40 mg by mouth daily.    [provider]  Red Yeast Rice Extract (RED YEAST RICE PO) Take 2 capsules by mouth daily.    [provider]  vitamin C (ASCORBIC ACID) 500 MG tablet Take 500 mg  by mouth daily.    [provider]    Allergies    Patient has no known allergies.  Review of Systems   Review of Systems  Genitourinary: Negative for dysuria.  Musculoskeletal:       Knee pain  Neurological: Negative for weakness, numbness and headaches.  All other systems reviewed and are negative.   Physical Exam Updated Vital Signs BP 125/70   Pulse 77   Temp 98.3 F (36.8 C)   Resp 18   Ht 5\' 10"  (1.778 m)   Wt 81.2 kg   SpO2 99%   BMI 25.68 kg/m   Physical Exam Vitals and nursing note reviewed.  Constitutional:      Appearance: He is well-developed.  HENT:     Head: Normocephalic and atraumatic.  Eyes:     General: No scleral icterus.       Right eye: No discharge.        Left eye: No discharge.     Conjunctiva/sclera: Conjunctivae normal.  Cardiovascular:     Pulses:           Dorsalis pedis pulses are 2+ on the right side and 2+ on the left side.  Pulmonary:     Effort: Pulmonary effort is normal.  Musculoskeletal:     Comments: Tenderness palpation noted to the distal femur and anterior aspect of the right knee with some mild overlying soft tissue swelling.  He does have a palpable deficit at the quad tendon insertion.  No bony deformity or crepitus noted.  Negative anterior and posterior drawer test.  No instability noted on varus or valgus stress.  No tenderness noted proximal tib-fib, distal tib-fib, ankle.  No tenderness noted to the right hip.  He can hold the leg in extension against gravity.  He is unable to actively extend and lift the leg up off the bed.  If I do passive lifting of his leg, he cannot hold the leg in extension against gravity and immediately falls.  No tenderness noted to the left lower extremity.  Skin:    General: Skin is warm and dry.     Capillary Refill: Capillary refill takes less than 2 seconds.     Comments: Good distal cap refill. LLE is not dusky in appearance or cool to touch.  Neurological:     Mental Status: He is alert.     Comments: Sensation intact along major nerve distributions of BLE  Psychiatric:        Speech: Speech normal.        Behavior: Behavior normal.     ED Results / Procedures / Treatments   Labs (all labs ordered are listed, but only abnormal results are displayed) Labs Reviewed - No data to display  EKG None  Radiology DG Knee Complete 4 Views Right  Result Date: 02/28/2020 CLINICAL DATA:  Status post fall. EXAM: RIGHT KNEE - COMPLETE 4+ VIEW COMPARISON:  None. FINDINGS: No evidence of fracture, dislocation, or joint effusion. No evidence of arthropathy or other focal bone abnormality. Soft tissues are unremarkable. IMPRESSION: Negative. Electronically Signed   By: Virgina Norfolk M.D.   On: 02/28/2020 22:27   DG Femur Min 2 Views Right  Result Date: 02/28/2020 CLINICAL DATA:  Status post fall.  EXAM: RIGHT FEMUR 2 VIEWS COMPARISON:  None. FINDINGS: There is no evidence of fracture or other focal bone lesions. Multiple radiopaque surgical clips are seen within the pelvis. IMPRESSION: No acute osseous abnormality. Electronically Signed   By: Hoover Browns  Houston M.D.   On: 02/28/2020 22:26    Procedures Procedures (including critical care time)  Medications Ordered in ED Medications  HYDROcodone-acetaminophen (NORCO/VICODIN) 5-325 MG per tablet 1 tablet (1 tablet Oral Given 02/28/20 2131)    ED Course  I have reviewed the triage vital signs and the nursing notes.  Pertinent labs & imaging results that were available during my care of the patient were reviewed by me and considered in my medical decision making (see chart for details).    MDM Rules/Calculators/A&P                          69 year old male who presents for evaluation of right knee pain after mechanical fall that occurred earlier this evening.  No head injury, LOC.  Difficulty ambulating bearing weight since the incident.  He can actively extend the leg on the table but cannot hold up the leg and hold it in extension against gravity.  Clinically, concern for possible quad tendon injury.  I do not feel any palpable deficit.  Also concern for fracture.  X-rays ordered.   X-ray reviewed.  Negative for any acute fracture or dislocation.  I reviewed the x-ray.  He does appear to have some slight irregularity noted to the patella.  Unclear exact etiology.  Discussed results with patient.  I discussed with him my concern for possible tendon disruption.  I instructed him that he will need to follow-up with outpatient orthopedic.  We will put him in a knee immobilizer and have him use crutches or a walker.  Patient will be given a short course of pain medication for acute pain. At this time, patient exhibits no emergent life-threatening condition that require further evaluation in ED or admission. Discussed patient with Dr. Roderic Palau who  is agreeable to plan. Patient had ample opportunity for questions and discussion. All patient's questions were answered with full understanding. Strict return precautions discussed. Patient expresses understanding and agreement to plan.   Portions of this note were generated with [employer]. Dictation errors may occur despite best attempts at proofreading.  Final Clinical Impression(s) / ED Diagnoses Final diagnoses:  Acute pain of right knee    Rx / DC Orders ED Discharge Orders         Ordered    HYDROcodone-acetaminophen (NORCO/VICODIN) 5-325 MG tablet  Hansen 6 hours PRN        02/28/20 2254    Walker standard        02/28/20 2255    Walker rolling        02/28/20 2255           Volanda Napoleon, PA-C 02/28/20 2258    Milton Ferguson, MD 03/01/20 1014

## 2020-03-01 ENCOUNTER — Telehealth: Payer: Self-pay | Admitting: Orthopedic Surgery

## 2020-03-01 NOTE — Telephone Encounter (Signed)
Patient called to inquire about appointment following [facility] emergency room visit on 02/28/20 for knee pain related to a fall. Xrays indicate no fracture, however, patient advised to see orthopaedist for further evaluation - patient said 'in the next couple days'.  Discussed next available (in 1 week, as Dr Aline Brochure was not on call). Patient and wife are discussing contacting ortho at [facility] clinic, which is close to where they live.  Will call back if need to schedule here.

## 2020-03-03 ENCOUNTER — Other Ambulatory Visit: Payer: Self-pay

## 2020-03-03 ENCOUNTER — Other Ambulatory Visit: Payer: Self-pay | Admitting: Surgery

## 2020-03-03 ENCOUNTER — Other Ambulatory Visit
Admission: RE | Admit: 2020-03-03 | Discharge: 2020-03-03 | Disposition: A | Discharge status: Home / Self Care | Payer: Medicare PPO | Source: Ambulatory Visit | Attending: Surgery | Admitting: Surgery

## 2020-03-03 DIAGNOSIS — Z01812 Encounter for preprocedural laboratory examination: Secondary | ICD-10-CM | POA: Diagnosis present

## 2020-03-03 DIAGNOSIS — Z20822 Contact with and (suspected) exposure to covid-19: Secondary | ICD-10-CM | POA: Diagnosis not present

## 2020-03-04 ENCOUNTER — Encounter
Admission: RE | Disposition: A | Discharge status: Home / Self Care | Payer: Self-pay | Source: Home / Self Care | Attending: Surgery

## 2020-03-04 ENCOUNTER — Ambulatory Visit
Admission: RE | Admit: 2020-03-04 | Discharge: 2020-03-04 | Disposition: A | Discharge status: Home / Self Care | Payer: Medicare PPO | Attending: Surgery | Admitting: Surgery

## 2020-03-04 ENCOUNTER — Ambulatory Visit: Payer: Medicare PPO | Admitting: Anesthesiology

## 2020-03-04 ENCOUNTER — Other Ambulatory Visit: Payer: Self-pay

## 2020-03-04 ENCOUNTER — Encounter: Payer: Self-pay | Admitting: Surgery

## 2020-03-04 DIAGNOSIS — Y9289 Other specified places as the place of occurrence of the external cause: Secondary | ICD-10-CM | POA: Insufficient documentation

## 2020-03-04 DIAGNOSIS — Z86010 Personal history of colonic polyps: Secondary | ICD-10-CM | POA: Diagnosis not present

## 2020-03-04 DIAGNOSIS — Z833 Family history of diabetes mellitus: Secondary | ICD-10-CM | POA: Insufficient documentation

## 2020-03-04 DIAGNOSIS — I10 Essential (primary) hypertension: Secondary | ICD-10-CM | POA: Diagnosis not present

## 2020-03-04 DIAGNOSIS — E785 Hyperlipidemia, unspecified: Secondary | ICD-10-CM | POA: Diagnosis not present

## 2020-03-04 DIAGNOSIS — K219 Gastro-esophageal reflux disease without esophagitis: Secondary | ICD-10-CM | POA: Diagnosis not present

## 2020-03-04 DIAGNOSIS — Y9389 Activity, other specified: Secondary | ICD-10-CM | POA: Insufficient documentation

## 2020-03-04 DIAGNOSIS — S76111A Strain of right quadriceps muscle, fascia and tendon, initial encounter: Secondary | ICD-10-CM | POA: Diagnosis not present

## 2020-03-04 DIAGNOSIS — W1843XA Slipping, tripping and stumbling without falling due to stepping from one level to another, initial encounter: Secondary | ICD-10-CM | POA: Insufficient documentation

## 2020-03-04 DIAGNOSIS — Z8546 Personal history of malignant neoplasm of prostate: Secondary | ICD-10-CM | POA: Insufficient documentation

## 2020-03-04 DIAGNOSIS — K589 Irritable bowel syndrome without diarrhea: Secondary | ICD-10-CM | POA: Diagnosis not present

## 2020-03-04 DIAGNOSIS — Z79899 Other long term (current) drug therapy: Secondary | ICD-10-CM | POA: Diagnosis not present

## 2020-03-04 DIAGNOSIS — Z8042 Family history of malignant neoplasm of prostate: Secondary | ICD-10-CM | POA: Insufficient documentation

## 2020-03-04 HISTORY — PX: QUADRICEPS TENDON REPAIR: SHX756

## 2020-03-04 LAB — SARS CORONAVIRUS 2 (TAT 6-24 HRS): SARS Coronavirus 2: NEGATIVE

## 2020-03-04 SURGERY — REPAIR, TENDON, QUADRICEPS
Anesthesia: General | Site: Knee | Laterality: Right

## 2020-03-04 MED ORDER — KETAMINE HCL 50 MG/ML IJ SOLN
INTRAMUSCULAR | Status: AC
  Filled 2020-03-04: qty 10

## 2020-03-04 MED ORDER — METOCLOPRAMIDE HCL 10 MG PO TABS: 5.0000 mg | ORAL_TABLET | Freq: Three times a day (TID) | ORAL | Status: DC | PRN

## 2020-03-04 MED ORDER — DEXAMETHASONE SODIUM PHOSPHATE 10 MG/ML IJ SOLN
INTRAMUSCULAR | Status: DC | PRN
  Administered 2020-03-04: 10 mg via INTRAVENOUS

## 2020-03-04 MED ORDER — ACETAMINOPHEN 325 MG PO TABS: 325.0000 mg | ORAL_TABLET | Freq: Four times a day (QID) | ORAL | Status: DC | PRN

## 2020-03-04 MED ORDER — HYDROMORPHONE HCL 1 MG/ML IJ SOLN
INTRAMUSCULAR | Status: AC
  Filled 2020-03-04: qty 1

## 2020-03-04 MED ORDER — CEFAZOLIN SODIUM-DEXTROSE 2-4 GM/100ML-% IV SOLN
INTRAVENOUS | Status: AC
  Filled 2020-03-04: qty 100

## 2020-03-04 MED ORDER — ACETAMINOPHEN 10 MG/ML IV SOLN
INTRAVENOUS | Status: AC
  Filled 2020-03-04: qty 100

## 2020-03-04 MED ORDER — FENTANYL CITRATE (PF) 100 MCG/2ML IJ SOLN
INTRAMUSCULAR | Status: AC
Start: 2020-03-04 — End: 2020-03-04
  Administered 2020-03-04: 25 ug via INTRAVENOUS
  Filled 2020-03-04: qty 2

## 2020-03-04 MED ORDER — GLYCOPYRROLATE 0.2 MG/ML IJ SOLN
INTRAMUSCULAR | Status: DC | PRN
  Administered 2020-03-04 (×2): .2 mg via INTRAVENOUS

## 2020-03-04 MED ORDER — METOCLOPRAMIDE HCL 5 MG/ML IJ SOLN: 5.0000 mg | Freq: Three times a day (TID) | INTRAMUSCULAR | Status: DC | PRN

## 2020-03-04 MED ORDER — LIDOCAINE HCL (CARDIAC) PF 100 MG/5ML IV SOSY
PREFILLED_SYRINGE | INTRAVENOUS | Status: DC | PRN
  Administered 2020-03-04: 100 mg via INTRAVENOUS

## 2020-03-04 MED ORDER — HYDROCODONE-ACETAMINOPHEN 5-325 MG PO TABS: 1.0000 | ORAL_TABLET | ORAL | Status: DC | PRN

## 2020-03-04 MED ORDER — KETAMINE HCL 50 MG/ML IJ SOLN
INTRAMUSCULAR | Status: DC | PRN
  Administered 2020-03-04 (×2): 5 mg via INTRAMUSCULAR

## 2020-03-04 MED ORDER — BUPIVACAINE HCL 0.5 % IJ SOLN
INTRAMUSCULAR | Status: DC | PRN
  Administered 2020-03-04: 20 mL

## 2020-03-04 MED ORDER — OXYCODONE HCL 5 MG PO TABS
ORAL_TABLET | ORAL | Status: AC
  Administered 2020-03-04: 5 mg via ORAL
  Filled 2020-03-04: qty 1

## 2020-03-04 MED ORDER — ORAL CARE MOUTH RINSE: 15.0000 mL | Freq: Once | OROMUCOSAL | Status: AC

## 2020-03-04 MED ORDER — ONDANSETRON HCL 4 MG/2ML IJ SOLN: 4.0000 mg | Freq: Four times a day (QID) | INTRAMUSCULAR | Status: DC | PRN

## 2020-03-04 MED ORDER — ONDANSETRON HCL 4 MG PO TABS: 4.0000 mg | ORAL_TABLET | Freq: Four times a day (QID) | ORAL | Status: DC | PRN

## 2020-03-04 MED ORDER — MIDAZOLAM HCL 2 MG/2ML IJ SOLN
INTRAMUSCULAR | Status: AC
  Filled 2020-03-04: qty 2

## 2020-03-04 MED ORDER — BUPIVACAINE-EPINEPHRINE (PF) 0.5% -1:200000 IJ SOLN
INTRAMUSCULAR | Status: AC
  Filled 2020-03-04: qty 30

## 2020-03-04 MED ORDER — MIDAZOLAM HCL 2 MG/2ML IJ SOLN
INTRAMUSCULAR | Status: DC | PRN
  Administered 2020-03-04 (×2): 1 mg via INTRAVENOUS

## 2020-03-04 MED ORDER — CHLORHEXIDINE GLUCONATE 0.12 % MT SOLN
OROMUCOSAL | Status: AC
  Administered 2020-03-04: 15 mL via OROMUCOSAL
  Filled 2020-03-04: qty 15

## 2020-03-04 MED ORDER — FENTANYL CITRATE (PF) 100 MCG/2ML IJ SOLN
INTRAMUSCULAR | Status: AC
  Filled 2020-03-04: qty 2

## 2020-03-04 MED ORDER — PROPOFOL 10 MG/ML IV BOLUS
INTRAVENOUS | Status: DC | PRN
  Administered 2020-03-04: 100 mg via INTRAVENOUS

## 2020-03-04 MED ORDER — OXYCODONE HCL 5 MG/5ML PO SOLN: 5.0000 mg | Freq: Once | ORAL | Status: AC | PRN

## 2020-03-04 MED ORDER — FENTANYL CITRATE (PF) 100 MCG/2ML IJ SOLN
INTRAMUSCULAR | Status: AC
  Administered 2020-03-04: 25 ug via INTRAVENOUS
  Filled 2020-03-04: qty 2

## 2020-03-04 MED ORDER — FENTANYL CITRATE (PF) 100 MCG/2ML IJ SOLN
25.0000 ug | INTRAMUSCULAR | Status: AC | PRN
  Administered 2020-03-04 (×4): 25 ug via INTRAVENOUS

## 2020-03-04 MED ORDER — OXYCODONE HCL 5 MG PO TABS: 5.0000 mg | ORAL_TABLET | Freq: Once | ORAL | Status: AC | PRN

## 2020-03-04 MED ORDER — KETOROLAC TROMETHAMINE 30 MG/ML IJ SOLN
INTRAMUSCULAR | Status: AC
  Administered 2020-03-04: 30 mg via INTRAVENOUS
  Filled 2020-03-04: qty 1

## 2020-03-04 MED ORDER — ONDANSETRON HCL 4 MG/2ML IJ SOLN
INTRAMUSCULAR | Status: DC | PRN
  Administered 2020-03-04: 4 mg via INTRAVENOUS

## 2020-03-04 MED ORDER — FENTANYL CITRATE (PF) 100 MCG/2ML IJ SOLN
INTRAMUSCULAR | Status: DC | PRN
Start: 2020-03-04 — End: 2020-03-04
  Administered 2020-03-04 (×4): 25 ug via INTRAVENOUS

## 2020-03-04 MED ORDER — CHLORHEXIDINE GLUCONATE 0.12 % MT SOLN: 15.0000 mL | Freq: Once | OROMUCOSAL | Status: AC

## 2020-03-04 MED ORDER — ACETAMINOPHEN 10 MG/ML IV SOLN
INTRAVENOUS | Status: DC | PRN
  Administered 2020-03-04: 1000 mg via INTRAVENOUS

## 2020-03-04 MED ORDER — KETOROLAC TROMETHAMINE 30 MG/ML IJ SOLN: 30.0000 mg | Freq: Once | INTRAMUSCULAR | Status: AC

## 2020-03-04 MED ORDER — ONDANSETRON HCL 4 MG/2ML IJ SOLN: 4.0000 mg | Freq: Once | INTRAMUSCULAR | Status: DC | PRN

## 2020-03-04 MED ORDER — PHENYLEPHRINE HCL (PRESSORS) 10 MG/ML IV SOLN
INTRAVENOUS | Status: DC | PRN
  Administered 2020-03-04: 100 ug via INTRAVENOUS

## 2020-03-04 MED ORDER — POTASSIUM CHLORIDE IN NACL 20-0.9 MEQ/L-% IV SOLN: INTRAVENOUS | Status: DC

## 2020-03-04 MED ORDER — CEFAZOLIN SODIUM-DEXTROSE 2-4 GM/100ML-% IV SOLN
2.0000 g | INTRAVENOUS | Status: AC
  Administered 2020-03-04: 2 g via INTRAVENOUS

## 2020-03-04 MED ORDER — LACTATED RINGERS IV SOLN: INTRAVENOUS | Status: DC

## 2020-03-04 SURGICAL SUPPLY — 54 items
ANCH SUT 2 5 PRELD ROTR CUF (Anchor) ×2 IMPLANT
ANCHOR SUT 5.0 TWINFIX 2 ULBRD (Anchor) ×6 IMPLANT
APL PRP STRL LF DISP 70% ISPRP (MISCELLANEOUS) ×1
BLADE SURG SZ10 CARB STEEL (BLADE) ×6 IMPLANT
BNDG COHESIVE 4X5 TAN STRL (GAUZE/BANDAGES/DRESSINGS) ×3 IMPLANT
BNDG ESMARK 6X12 TAN STRL LF (GAUZE/BANDAGES/DRESSINGS) ×3 IMPLANT
CANISTER SUCT 1200ML W/VALVE (MISCELLANEOUS) ×3 IMPLANT
CHLORAPREP W/TINT 26 (MISCELLANEOUS) ×3 IMPLANT
COVER WAND RF STERILE (DRAPES) ×3 IMPLANT
CUFF TOURN 24 STER (MISCELLANEOUS) ×3 IMPLANT
CUFF TOURN 30 STER DUAL PORT (MISCELLANEOUS) IMPLANT
DRAPE 3/4 80X56 (DRAPES) ×3 IMPLANT
DRAPE INCISE IOBAN 66X45 STRL (DRAPES) IMPLANT
DRAPE SPLIT 6X30 W/TAPE (DRAPES) IMPLANT
DRAPE SURG 17X11 SM STRL (DRAPES) ×6 IMPLANT
DRSG OPSITE POSTOP 4X10 (GAUZE/BANDAGES/DRESSINGS) IMPLANT
DRSG OPSITE POSTOP 4X12 (GAUZE/BANDAGES/DRESSINGS) IMPLANT
DRSG OPSITE POSTOP 4X6 (GAUZE/BANDAGES/DRESSINGS) IMPLANT
DRSG OPSITE POSTOP 4X8 (GAUZE/BANDAGES/DRESSINGS) ×3 IMPLANT
ELECT CAUTERY BLADE 6.4 (BLADE) ×3 IMPLANT
ELECT REM PT RETURN 9FT ADLT (ELECTROSURGICAL) ×3
ELECTRODE REM PT RTRN 9FT ADLT (ELECTROSURGICAL) ×1 IMPLANT
GAUZE SPONGE 4X4 12PLY STRL (GAUZE/BANDAGES/DRESSINGS) ×3 IMPLANT
GAUZE XEROFORM 1X8 LF (GAUZE/BANDAGES/DRESSINGS) ×3 IMPLANT
GAUZE XEROFORM 4X4 STRL (GAUZE/BANDAGES/DRESSINGS) ×3 IMPLANT
GLOVE BIO SURGEON STRL SZ8 (GLOVE) ×6 IMPLANT
GLOVE INDICATOR 8.0 STRL GRN (GLOVE) ×3 IMPLANT
GOWN STRL REUS W/ TWL LRG LVL3 (GOWN DISPOSABLE) ×2 IMPLANT
GOWN STRL REUS W/ TWL XL LVL3 (GOWN DISPOSABLE) ×1 IMPLANT
GOWN STRL REUS W/TWL LRG LVL3 (GOWN DISPOSABLE) ×6
GOWN STRL REUS W/TWL XL LVL3 (GOWN DISPOSABLE) ×3
HANDLE YANKAUER SUCT BULB TIP (MISCELLANEOUS) ×3 IMPLANT
IMMBOLIZER KNEE 19 BLUE UNIV (SOFTGOODS) ×3 IMPLANT
KIT TURNOVER KIT A (KITS) ×3 IMPLANT
LOOP FIBERTAPE 2MM 8 WHT BLK (VASCULAR PRODUCTS) ×3 IMPLANT
NDL MAYO CATGUT SZ1 (NEEDLE) ×3
NEEDLE MAYO CATGUT SZ1 (NEEDLE) ×1 IMPLANT
NS IRRIG 1000ML POUR BTL (IV SOLUTION) ×3 IMPLANT
PACK EXTREMITY (MISCELLANEOUS) ×3 IMPLANT
RETRIEVER SUT HEWSON (MISCELLANEOUS) ×3 IMPLANT
SPONGE LAP 18X18 RF (DISPOSABLE) ×6 IMPLANT
STAPLER SKIN PROX 35W (STAPLE) ×3 IMPLANT
STOCKINETTE IMPERVIOUS 9X36 MD (GAUZE/BANDAGES/DRESSINGS) ×3 IMPLANT
SUT ETHIBOND #5 BRAIDED 30INL (SUTURE) ×3 IMPLANT
SUT FIBERWIRE #2 38 BLUE 1/2 (SUTURE)
SUT FIBERWIRE #2 38 T-5 BLUE (SUTURE) ×6
SUT VIC AB 0 CT1 36 (SUTURE) ×6 IMPLANT
SUT VIC AB 2-0 CT1 27 (SUTURE) ×6
SUT VIC AB 2-0 CT1 TAPERPNT 27 (SUTURE) ×2 IMPLANT
SUT VIC AB 2-0 CT2 27 (SUTURE) ×6 IMPLANT
SUT VIC AB 3-0 SH 27 (SUTURE) ×6
SUT VIC AB 3-0 SH 27X BRD (SUTURE) ×2 IMPLANT
SUTURE FIBERWR #2 38 BLUE 1/2 (SUTURE) IMPLANT
SUTURE FIBERWR #2 38 T-5 BLUE (SUTURE) ×2 IMPLANT

## 2020-03-04 NOTE — Op Note (Signed)
03/04/2020  5:07 PM  Patient:   Gavin Hansen  Pre-Op Diagnosis:   Acute quadriceps tendon rupture, right knee.  Post-Op Diagnosis:   Same.  Procedure:   Primary repair of right quadriceps tendon rupture.  Surgeon:   Pascal Lux, MD  Assistant:   None  Anesthesia:   General LMA  Findings:   As above.  Complications:   None  Fluids:   1000 cc crystalloid  EBL:   5 cc  TT:   54 minutes at 300 mmHg  Drains:   None  Closure:   Staples  Implants:   [employer] 4.5 mm titanium TwinFix anchors 2  Brief Clinical Note:   The patient is a 69 year old male who sustained the above-noted injury 5 days ago when he stumbled while coming out of a [employer] in his hometown. He presented to the local emergency room where he was diagnosed with a quadriceps tendon rupture and advised to follow-up with orthopedics. The patient presents at this time for definitive management of this injury.  Procedure:   The patient was brought into the operating room and lain in the supine position. After adequate general laryngeal mask anesthesia was obtained, the patient's right lower extremity was prepped with ChloraPrep solution before being draped sterilely. Preoperative antibiotics were administered. A timeout was performed to verify the appropriate surgical site before the limb was exsanguinated with an Esmarch and the thigh tourniquet inflated to 300 mmHg.   An approximately 4-5 inch incision was made over the anterior aspect of the knee centered at the superior pole of the patella. The incision was carried down through the subcutaneous tissues to expose the superficial retinaculum. This was split the length of the incision to reveal the ruptured quadriceps tendon. The torn end of the tendon was debrided sharply back to good tissues while the attachment site at the superior pole of the patella was debrided down to bleeding bone with a rongeur. Two [employer]hew titanium TwinFix anchors were  inserted into the superior pole of the patella after predrilling each hole. Each set of sutures was woven through the quadriceps tendon and tied securely before being brought back and secured through the distal tendon tissue. Additional #2 FiberWire's were used to repair the medial and lateral retinacular tissues. The repair was deemed stable with knee flexion to 85, supporting the weight of the lower leg independently in the process.  The wound was copiously irrigated with sterile saline solution using bulb irrigation before the superficial retinacular layer was re-approximated using #0 Vicryl interrupted sutures. The subcutaneous tissues were closed in two layers using 2-0 Vicryl interrupted sutures before the skin was closed using staples. A total of 13 cc of 0.5% Sensorcaine with epinephrine was injected in and around the incision site to help with postoperative analgesia before a sterile occlusive dressing was applied to the knee. The leg was placed into a postoperative hinged knee brace with the hinges set at 0-70, but locked in extension. The patient was then awakened, extubated, and returned to the recovery room in satisfactory condition after tolerating the procedure well.

## 2020-03-04 NOTE — H&P (Signed)
History of Present Illness: Gavin Hansen is a 69 y.o. who present secondary to pain to the right knee. Patient states that on 02/28/2019 he was stepping off a curb and knee gave way followed by increased discomfort and swelling. He said no prior problems with his knee. Did take ibuprofen and Tylenol initially. Has not taken anything in 2 days. [facility] he was fitted with crutches and knee brace. Presents for follow-up evaluation.  Past Medical History: . Excessive flatus 02/21/2019  . Hx of adenomatous colonic polyps 02/17/2019  . Hyperlipidemia  . Hypertension  . Prostate cancer (CMS-HCC)  . Refusal of blood transfusions as patient is Jehovah's Witness 02/17/2019   Past Surgical History: . COLONOSCOPY 04/19/2011  Adenomatous Polyp: CBF 04/2016; Recall Ltr mailed 02/21/2016 (dw)  . COLONOSCOPY 04/27/2016  Adenomatous Polyp: CBF 04/2019  . COLONOSCOPY 04/23/2019  Negative colon biopsy/PHx CP/Repeat 26yrs/TKT  . EGD 06/01/2014  No repeat per RTE (dw)  . PROSTATE SURGERY   Past Family History: . Prostate cancer Father  . Diabetes Brother   Medications: . ascorbic acid, vitamin C, (VITAMIN C) 100 MG tablet Take by mouth  . evening primrose oil (EVENING PRIMROSE ORAL) Take by mouth once daily  . HYDROcodone-acetaminophen (NORCO) 5-325 mg tablet TAKE 1 TO 2 TABLETS BY MOUTH EVERY 6 HOURS AS NEEDED  . losartan (COZAAR) 50 MG tablet Take 50 mg by mouth Daily.  . MULTIVITAMIN (DAILY VITAMIN ORAL) Take by mouth.  Marland Kitchen omeprazole (PRILOSEC) 20 MG DR capsule Take 20 mg by mouth once daily.  . RED YEAST RICE ORAL Take by mouth  . TURMERIC ORAL Take by mouth once daily  . vitamin B complex (B COMPLEX 1 ORAL) Take 1 tablet by mouth once daily   No current Epic-ordered facility-administered medications on file.   Allergies: No Known Allergies   Review of Systems: A comprehensive 14 point ROS was performed, reviewed, and the pertinent orthopaedic findings are  documented in the HPI.  Physical Exam: BP 120/80  Ht 180.3 cm (5' 10.98")  Wt 85.5 kg (188 lb 6.4 oz)  BMI 26.29 kg/m   General:  Alert, no acute distress Psychiatric:  Patient is competent for consent with normal mood and affect Cardiovascular:  RRR  Respiratory:  Clear to auscultation. No wheezing. Non-labored breathing GI:  Abdomen is soft and non-tender Skin:  No lesions in the area of chief complaint Neurologic:  Sensation intact distally Lymphatic:  No axillary or cervical lymphadenopathy  Orthopedic Exam: The patient is a well-nourished and well-developed gentleman who has difficulty ambulating on today's visit. He is not able to do an active straight leg raise. He is tender to palpation over the quadriceps tendon region. There does appear to be a palpable defect in this region. Mild swelling was noted, but there is no effusion.  He is neurovascularly intact to the right lower extremity and foot.  X-ray: AP standing, lateral and sunrise view of the right knee ordered and interpreted on today's visit shows good preservation of the medial lateral compartment space. I did not appreciate any osteophytes or subchondral sclerosis. Patella appears to be tracking well. On the lateral view there is some increased density of the quadriceps tendon region but I did not see an obvious defect.  Impression: Rupture right quadriceps tendon.  Plan:  The treatment options, including both surgical and nonsurgical choices, have been discussed in detail with the patient, and he would like to proceed with a formal primary repair of  the ruptured right quadriceps tendon. The risks (including bleeding, infection, nerve and/or blood vessel injury, persistent or recurrent pain, weakness of the quadricep strength, decreased range of motion, failure of the repair, recurrent tear, need for further surgery, blood clots, strokes, heart attacks or arrhythmias, pneumonia, etc.) and benefits of the surgical procedure  were discussed. The patient states his understanding and agrees to proceed. A formal written consent will be obtained by the nursing staff.   H&P reviewed and patient re-examined. No changes.

## 2020-03-04 NOTE — Anesthesia Procedure Notes (Signed)
Procedure Name: LMA Insertion Performed by: Kelton Pillar, CRNA Pre-anesthesia Checklist: Patient identified, Emergency Drugs available, Suction available and Patient being monitored Patient Re-evaluated:Patient Re-evaluated prior to induction Oxygen Delivery Method: Circle system utilized Preoxygenation: Pre-oxygenation with 100% oxygen Induction Type: IV induction Ventilation: Mask ventilation without difficulty LMA: LMA inserted LMA Size: 4.5 Placement Confirmation: positive ETCO2,  CO2 detector and breath sounds checked- equal and bilateral Tube secured with: Tape Dental Injury: Teeth and Oropharynx as per pre-operative assessment

## 2020-03-04 NOTE — Anesthesia Preprocedure Evaluation (Addendum)
Anesthesia Evaluation  Patient identified by MRN, date of birth, ID band Patient awake    Reviewed: Allergy & Precautions, H&P , NPO status , Patient's Chart, lab work & pertinent test results  History of Anesthesia Complications Negative for: history of anesthetic complications  Airway Mallampati: I  TM Distance: >3 FB     Dental  (+) Chipped   Pulmonary neg pulmonary ROS, neg sleep apnea, neg COPD,    breath sounds clear to auscultation       Cardiovascular hypertension, (-) angina(-) Past MI and (-) Cardiac Stents (-) dysrhythmias  Rhythm:regular Rate:Normal     Neuro/Psych negative neurological ROS  negative psych ROS   GI/Hepatic Neg liver ROS, GERD  ,  Endo/Other  negative endocrine ROS  Renal/GU      Musculoskeletal   Abdominal   Peds  Hematology negative hematology ROS (+)   Anesthesia Other Findings Past Medical History: 2009: Cancer ([place])     Comment:  prostate No date: Colon polyps No date: Excessive flatus No date: GERD (gastroesophageal reflux disease) No date: Hemorrhoid No date: Hyperlipidemia No date: Hypertension No date: IBS (irritable bowel syndrome) 2014: Left inguinal hernia No date: Prostate cancer ([place])  Past Surgical History: No date: COLONOSCOPY 04/23/2019: COLONOSCOPY WITH PROPOFOL; N/A     Comment:  Procedure: COLONOSCOPY WITH PROPOFOL;  Surgeon: [place],               Benay Pike, MD;  Location: [facility] ENDOSCOPY;  Service:               Gastroenterology;  Laterality: N/A; No date: ESOPHAGOGASTRODUODENOSCOPY 07-01-14: HERNIA REPAIR; Left 2009: PROSTATE SURGERY  BMI    Body Mass Index: 25.69 kg/m      Reproductive/Obstetrics negative OB ROS                            Anesthesia Physical Anesthesia Plan  ASA: II  Anesthesia Plan: General LMA   Post-op Pain Management:    Induction:   PONV Risk Score and Plan: Dexamethasone, Ondansetron,  Midazolam and Treatment may vary due to age or medical condition  Airway Management Planned:   Additional Equipment:   Intra-op Plan:   Post-operative Plan:   Informed Consent: I have reviewed the patients History and Physical, chart, labs and discussed the procedure including the risks, benefits and alternatives for the proposed anesthesia with the patient or authorized representative who has indicated his/her understanding and acceptance.     Dental Advisory Given  Plan Discussed with: Anesthesiologist, CRNA and Surgeon  Anesthesia Plan Comments:         Anesthesia Quick Evaluation

## 2020-03-04 NOTE — Transfer of Care (Signed)
Immediate Anesthesia Transfer of Care Note  Patient: Gavin Hansen  Procedure(s) Performed: REPAIR QUADRICEP TENDON (Right Knee)  Patient Location: PACU  Anesthesia Type:General  Level of Consciousness: drowsy  Airway & Oxygen Therapy: Patient Spontanous Breathing and Patient connected to face mask oxygen  Post-op Assessment: Report given to RN and Post -op Vital signs reviewed and stable  Post vital signs: Reviewed and stable  Last Vitals:  Vitals Value Taken Time  BP 131/85 03/04/20 1710  Temp 36 C 03/04/20 1710  Pulse 79 03/04/20 1713  Resp 20 03/04/20 1713  SpO2 100 % 03/04/20 1713  Vitals shown include unvalidated device data.  Last Pain:  Vitals:   03/04/20 1710  TempSrc:   PainSc: Asleep         Complications: No complications documented.

## 2020-03-04 NOTE — OR Nursing (Signed)
Gold colored wedding band given to wife.

## 2020-03-04 NOTE — Discharge Instructions (Addendum)
Orthopedic discharge instructions: May shower with intact OpSite dressing, then reapply brace and Polar Care device.  Apply ice frequently to knee or use Polar Care. Take ibuprofen 600-800 mg TID with meals for 7-10 days, then as necessary. Take pain medication as prescribed when needed.  May supplement with ES Tylenol if necessary. May weight-bear as tolerated so long as in brace locked in extension - use crutches as needed. Follow-up in 10-14 days or as scheduled.  AMBULATORY SURGERY  DISCHARGE INSTRUCTIONS   1) The drugs that you were given will stay in your system until tomorrow so for the next 24 hours you should not:  A) Drive an automobile B) Make any legal decisions C) Drink any alcoholic beverage   2) You may resume regular meals tomorrow.  Today it is better to start with liquids and gradually work up to solid foods.  You may eat anything you prefer, but it is better to start with liquids, then soup and crackers, and gradually work up to solid foods.   3) Please notify your doctor immediately if you have any unusual bleeding, trouble breathing, redness and pain at the surgery site, drainage, fever, or pain not relieved by medication.    4) Additional Instructions:        Please contact your physician with any problems or Same Day Surgery at [phone], Monday through Friday 6 am to 4 pm, or [place] at [facility] number at [phone].

## 2020-03-04 NOTE — Progress Notes (Signed)
Pt pain 5/10. After 181mcg Fentanyl and 5mg  Oxycodone. Dr. Andree Elk notified. Acknowledged. Orders received.

## 2020-03-05 ENCOUNTER — Encounter: Payer: Self-pay | Admitting: Surgery

## 2020-03-17 NOTE — Anesthesia Postprocedure Evaluation (Signed)
Anesthesia Post Note  Patient: Gavin Hansen  Procedure(s) Performed: REPAIR QUADRICEP TENDON (Right Knee)  Patient location during evaluation: PACU Anesthesia Type: General Level of consciousness: awake and alert Pain management: pain level controlled Vital Signs Assessment: post-procedure vital signs reviewed and stable Respiratory status: spontaneous breathing, nonlabored ventilation, respiratory function stable and patient connected to nasal cannula oxygen Cardiovascular status: blood pressure returned to baseline and stable Postop Assessment: no apparent nausea or vomiting Anesthetic complications: no   No complications documented.   Last Vitals:  Vitals:   03/04/20 1841 03/04/20 1855  BP: 127/77 118/74  Pulse: 79 83  Resp: 13 17  Temp: 36.4 C 36.7 C  SpO2: 100% 98%    Last Pain:  Vitals:   03/05/20 1857  TempSrc:   PainSc: 0-No pain                 Molli Barrows

## 2024-08-25 ENCOUNTER — Ambulatory Visit: Admitting: Internal Medicine
